# Patient Record
Sex: Female | Born: 2000
Health system: Southern US, Community
[De-identification: ages and names within clinical notes are randomized; demographics above are authoritative.]

## PROBLEM LIST (undated history)

## (undated) DIAGNOSIS — F319 Bipolar disorder, unspecified: Secondary | ICD-10-CM

## (undated) DIAGNOSIS — J45909 Unspecified asthma, uncomplicated: Secondary | ICD-10-CM

---
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
## 2020-02-09 ENCOUNTER — Other Ambulatory Visit: Payer: Self-pay

## 2020-03-18 ENCOUNTER — Other Ambulatory Visit: Payer: Self-pay

## 2020-03-18 DIAGNOSIS — Z20822 Contact with and (suspected) exposure to covid-19: Secondary | ICD-10-CM

## 2020-03-19 LAB — NOVEL CORONAVIRUS, NAA: SARS-CoV-2, NAA: DETECTED — AB

## 2020-03-19 LAB — SARS-COV-2, NAA 2 DAY TAT

## 2021-04-07 DIAGNOSIS — F411 Generalized anxiety disorder: Secondary | ICD-10-CM | POA: Insufficient documentation

## 2021-04-07 DIAGNOSIS — E66813 Obesity, class 3: Secondary | ICD-10-CM | POA: Insufficient documentation

## 2021-04-07 DIAGNOSIS — J4520 Mild intermittent asthma, uncomplicated: Secondary | ICD-10-CM | POA: Insufficient documentation

## 2021-04-07 DIAGNOSIS — F330 Major depressive disorder, recurrent, mild: Secondary | ICD-10-CM | POA: Insufficient documentation

## 2023-06-04 ENCOUNTER — Encounter (HOSPITAL_BASED_OUTPATIENT_CLINIC_OR_DEPARTMENT_OTHER): Payer: Self-pay | Admitting: Family Medicine

## 2023-06-04 ENCOUNTER — Ambulatory Visit (INDEPENDENT_AMBULATORY_CARE_PROVIDER_SITE_OTHER): Payer: Self-pay | Admitting: Family Medicine

## 2023-06-04 VITALS — BP 112/50 | HR 87 | Ht 60.0 in | Wt 238.6 lb

## 2023-06-04 DIAGNOSIS — Z0000 Encounter for general adult medical examination without abnormal findings: Secondary | ICD-10-CM

## 2023-06-04 DIAGNOSIS — B353 Tinea pedis: Secondary | ICD-10-CM | POA: Diagnosis not present

## 2023-06-04 MED ORDER — TERBINAFINE HCL 1 % EX CREA: TOPICAL_CREAM | CUTANEOUS | 3 refills | Status: AC

## 2023-06-04 NOTE — Assessment & Plan Note (Signed)
 Previously used clotrimazole without significant relief.  She more recently has tried terbinafine and feels that she did have some benefit from it, however only used for about 1 week before running out of over-the-counter medication.  We discussed considerations.  Would be reasonable to proceed with extended trial of terbinafine and monitor response.  Would expect to see clearance within 3 to 4 weeks of use of topical treatment.  Advised that if symptoms persist beyond this timeframe, to let us  know and we can switch to alternative topical medication and likely proceed with referral to podiatry.

## 2023-06-04 NOTE — Progress Notes (Signed)
 New Patient Office Visit  Subjective   Patient ID: Leah Avila, female    DOB: 01-Nov-2000  Age: 23 y.o. MRN: [id number]  CC:  Chief Complaint  Patient presents with   New Patient (Initial Visit)    New patient needs prescription for athletes foot its the left foot foot is peeling has been going on for 1 year last pcp was sara b gordon     HPI [name] presents to establish care Last PCP - Lennard Quirk  Asthma: infrequent symptoms. Does use albuterol as needed - notes that it will be used with activity at times and when sick. Otherwise denies significant symptoms.  Reports issues with athlete's foot - has been chronic issue, was prescribed clotrimazole by last PCP, did improve right foot but left foot still with flaking, no significant itching  History of Bipolar, was taking various medications in the past. Was seeing psychiatrist in the past related to medication management. Has been about 7 years since stopping medications, took from about 23 yo until 23 yo.  Patient is originally from [place] , raised in [place]. Patient works at a [employer], but will be starting school for Psychology. She enjoys walking, reading, video games, singing.  Outpatient Encounter Medications as of 06/04/2023  Medication Sig   ibuprofen (ADVIL,MOTRIN) 200 MG tablet Take 200 mg by mouth 2 (two) times daily as needed for headache (pain).   terbinafine (LAMISIL) 1 % cream Apply to affected area BID until rash gone, then apply 2 more days.   [DISCONTINUED] ARIPiprazole (ABILIFY) 5 MG tablet Take 5 mg by mouth daily.   [DISCONTINUED] ARIPiprazole (ABILIFY) 5 MG tablet Take 5 mg by mouth daily. (Patient not taking: Reported on 06/04/2023)   [DISCONTINUED] fluvoxaMINE (LUVOX) 50 MG tablet Take 50 mg by mouth at bedtime.   [DISCONTINUED] fluvoxaMINE (LUVOX) 50 MG tablet Take 50 mg by mouth at bedtime.   [DISCONTINUED] lamoTRIgine (LAMICTAL) 200 MG tablet Take 200 mg by mouth daily.    [DISCONTINUED] lamoTRIgine (LAMICTAL) 200 MG tablet Take 200 mg by mouth at bedtime.   [DISCONTINUED] montelukast (SINGULAIR) 5 MG chewable tablet Chew 5 mg by mouth at bedtime.   [DISCONTINUED] montelukast (SINGULAIR) 5 MG chewable tablet Chew 5 mg by mouth at bedtime.   [DISCONTINUED] Multiple Vitamins-Minerals (EMERGEN-C VITAMIN C PO) Take 1 tablet by mouth daily as needed (immune system boost/ to help fight cold).    [DISCONTINUED] omeprazole  (PRILOSEC) 20 MG capsule Take 1 capsule (20 mg total) by mouth daily.   [DISCONTINUED] omeprazole  (PRILOSEC) 20 MG capsule Take 20 mg by mouth See admin instructions. Take 1 capsule (20 mg) by mouth daily for 30 days - start date 10/10/16   [DISCONTINUED] Paliperidone 1.5 MG TB24 Take by mouth at bedtime.   No facility-administered encounter medications on file as of 06/04/2023.    Past Medical History:  Diagnosis Date   Asthma    Bipolar 1 disorder (HCC)    Bipolar 1 disorder, depressed (HCC)     History reviewed. No pertinent surgical history.  History reviewed. No pertinent family history.  Social History   Socioeconomic History   Marital status: Single    Spouse name: Not on file   Number of children: Not on file   Years of education: Not on file   Highest education level: Not on file  Occupational History   Not on file  Tobacco Use   Smoking status: Never    Passive exposure: Never   Smokeless tobacco: Never  Vaping  Use   Vaping status: Never Used  Substance and Sexual Activity   Alcohol use: No   Drug use: No   Sexual activity: Not on file  Other Topics Concern   Not on file  Social History Narrative   ** Merged History Encounter **       ** Merged History Encounter **       Social Drivers of Health   Financial Resource Strain: Low Risk  (04/11/2022)   Received from [facility]   Overall Financial Resource Strain (CARDIA)    Difficulty of Paying Living Expenses: Not hard at all  Food Insecurity: No Food Insecurity  (04/11/2022)   Received from [facility]   Hunger Vital Sign    Worried About Running Out of Food in the Last Year: Never true    Ran Out of Food in the Last Year: Never true  Transportation Needs: No Transportation Needs (04/11/2022)   Received from [facility] - Transportation    Lack of Transportation (Medical): No    Lack of Transportation (Non-Medical): No  Physical Activity: Sufficiently Active (04/11/2022)   Received from [facility]   Exercise Vital Sign    Days of Exercise per Week: 7 days    Minutes of Exercise per Session: 60 min  Stress: No Stress Concern Present (04/11/2022)   Received from [facility] - Occupational Stress Questionnaire    Feeling of Stress : Not at all  Social Connections: Socially Integrated (04/11/2022)   Received from [facility]   Social Network    How would you rate your social network (family, work, friends)?: Good participation with social networks  Intimate Partner Violence: Not At Risk (04/11/2022)   Received from [facility]   HITS    Over the last 12 months how often did your partner physically hurt you?: Never    Over the last 12 months how often did your partner insult you or talk down to you?: Never    Over the last 12 months how often did your partner threaten you with physical harm?: Never    Over the last 12 months how often did your partner scream or curse at you?: Never    Objective   BP (!) 112/50 (BP Location: Right Arm, Patient Position: Sitting, Cuff Size: Normal)   Pulse 87   Ht 5' (1.524 m)   Wt 238 lb 9.6 oz (108.2 kg)   SpO2 96%   BMI 46.60 kg/m   Physical Exam  23 year old female in no acute distress, vital signs stable Cardiovascular exam with regular rate and rhythm Lungs clear to auscultation bilaterally Bilateral feet with slight flaking/areas of dry skin noted primarily over toes, left worse than right.  No erythema noted.  No oozing, drainage or  discharge present.  Between 4th and 5th toe, there is an area of slight maceration without any fluid noted, no increased moistness on appearance.  All other interdigital spaces are normal-appearing.  Assessment & Plan:   Tinea pedis of both feet Assessment & Plan: Previously used clotrimazole without significant relief.  She more recently has tried terbinafine and feels that she did have some benefit from it, however only used for about 1 week before running out of over-the-counter medication.  We discussed considerations.  Would be reasonable to proceed with extended trial of terbinafine and monitor response.  Would expect to see clearance within 3 to 4 weeks of use of topical treatment.  Advised that  if symptoms persist beyond this timeframe, to let us  know and we can switch to alternative topical medication and likely proceed with referral to podiatry.   Wellness examination -     CBC with Differential/Platelet; Future -     Hemoglobin A1c; Future -     Comprehensive metabolic panel with GFR; Future -     Lipid panel; Future -     TSH Rfx on Abnormal to Free T4; Future  Other orders -     Terbinafine HCl; Apply to affected area BID until rash gone, then apply 2 more days.  Dispense: 30 g; Refill: 3  Return in about 4 months (around 10/05/2023) for CPE with fasting labs 1 week prior.    ___________________________________________ Bridney Guadarrama de Peru, MD, ABFM, CAQSM Primary Care and Sports Medicine [facility]

## 2023-06-04 NOTE — Patient Instructions (Signed)
  Medication Instructions:  Your physician recommends that you continue on your current medications as directed. Please refer to the Current Medication list given to you today. --If you need a refill on any your medications before your next appointment, please call your pharmacy first. If no refills are authorized on file call the office.-- Lab Work: Your physician has recommended that you have lab work today: 1 week before next visit  If you have labs (blood work) drawn today and your tests are completely normal, you will receive your results via MyChart message OR a phone call from our staff.  Please ensure you check your voicemail in the event that you authorized detailed messages to be left on a delegated number. If you have any lab test that is abnormal or we need to change your treatment, we will call you to review the results.  Follow-Up: Your next appointment:   Your physician recommends that you schedule a follow-up appointment in: 3-6 months physical  with Dr. de Peru  You will receive a text message or e-mail with a link to a survey about your care and experience with us  today! We would greatly appreciate your feedback!   Thanks for letting us  be apart of your health journey!!  Primary Care and Sports Medicine   Dr. Court Distance Peru   We encourage you to activate your patient portal called "MyChart".  Sign up information is provided on this After Visit Summary.  MyChart is used to connect with patients for Virtual Visits (Telemedicine).  Patients are able to view lab/test results, encounter notes, upcoming appointments, etc.  Non-urgent messages can be sent to your provider as well. To learn more about what you can do with MyChart, please visit --  [email or web address].

## 2023-10-06 ENCOUNTER — Encounter (HOSPITAL_BASED_OUTPATIENT_CLINIC_OR_DEPARTMENT_OTHER): Admitting: Family Medicine
